# Patient Record
Sex: Female | Born: 1965 | Race: Black or African American | Hispanic: No | Marital: Single | Smoking: Former smoker
Health system: Southern US, Community
[De-identification: ages and names within clinical notes are randomized; demographics above are authoritative.]

## PROBLEM LIST (undated history)

## (undated) DIAGNOSIS — K219 Gastro-esophageal reflux disease without esophagitis: Secondary | ICD-10-CM

## (undated) DIAGNOSIS — C50919 Malignant neoplasm of unspecified site of unspecified female breast: Secondary | ICD-10-CM

## (undated) HISTORY — PX: ABDOMINAL HYSTERECTOMY: SHX81

## (undated) HISTORY — PX: ELBOW SURGERY: SHX618

---

## 2014-09-13 DIAGNOSIS — C50919 Malignant neoplasm of unspecified site of unspecified female breast: Secondary | ICD-10-CM

## 2014-09-13 HISTORY — DX: Malignant neoplasm of unspecified site of unspecified female breast: C50.919

## 2016-06-21 ENCOUNTER — Emergency Department (HOSPITAL_BASED_OUTPATIENT_CLINIC_OR_DEPARTMENT_OTHER)
Admission: EM | Admit: 2016-06-21 | Discharge: 2016-06-21 | Disposition: A | Discharge status: Home / Self Care | Payer: Commercial Managed Care - HMO | Attending: Emergency Medicine | Admitting: Emergency Medicine

## 2016-06-21 ENCOUNTER — Encounter (HOSPITAL_BASED_OUTPATIENT_CLINIC_OR_DEPARTMENT_OTHER): Payer: Self-pay | Admitting: *Deleted

## 2016-06-21 DIAGNOSIS — H1132 Conjunctival hemorrhage, left eye: Secondary | ICD-10-CM | POA: Diagnosis not present

## 2016-06-21 DIAGNOSIS — Z853 Personal history of malignant neoplasm of breast: Secondary | ICD-10-CM | POA: Diagnosis not present

## 2016-06-21 DIAGNOSIS — H579 Unspecified disorder of eye and adnexa: Secondary | ICD-10-CM | POA: Diagnosis present

## 2016-06-21 HISTORY — DX: Gastro-esophageal reflux disease without esophagitis: K21.9

## 2016-06-21 HISTORY — DX: Malignant neoplasm of unspecified site of unspecified female breast: C50.919

## 2016-06-21 NOTE — ED Provider Notes (Signed)
[facility] Provider Note   CSN: [id number] Arrival date & time: 06/21/16  1329     History   Chief Complaint Chief Complaint  Patient presents with  . Eye Problem    HPI Alyssa Benitez is a 50 y.o. female.  HPI Alyssa Benitez is a 51 y.o. female with PMH significant for acid reflux and breast cancer currently on chemo who presents with non-painful, constant, mild left eye redness she noticed this morning upon wakening.  No eye pain, visual disturbance, eye pain with movement, eye swelling, photophobia, injury/trauma, fever, chills.  No medications PTA.  She called her oncologist who instructed her to come to the ED for evaluation.  Past Medical History:  Diagnosis Date  . Acid reflux   . Breast cancer ([place])     There are no active problems to display for this patient.   History reviewed. No pertinent surgical history.  OB History    No data available       Home Medications    Prior to Admission medications   Not on File    Family History History reviewed. No pertinent family history.  Social History Social History  Substance Use Topics  . Smoking status: Never Smoker  . Smokeless tobacco: Never Used  . Alcohol use Not on file     Allergies   Morphine and related and Vicodin [hydrocodone-acetaminophen]   Review of Systems Review of Systems All other systems negative unless otherwise stated in HPI   Physical Exam Updated Vital Signs BP 112/83 (BP Location: Right Arm)   Pulse 97   Temp 98.3 F (36.8 C) (Oral)   Resp 18   SpO2 100%   Physical Exam  Constitutional: She is oriented to person, place, and time. She appears well-developed and well-nourished.  HENT:  Head: Normocephalic and atraumatic.  Right Ear: External ear normal.  Left Ear: External ear normal.  Eyes: EOM and lids are normal. Pupils are equal, round, and reactive to light. Lids are everted and swept, no foreign bodies found. Right eye exhibits no chemosis, no  discharge, no exudate and no hordeolum. No foreign body present in the right eye. Left eye exhibits no chemosis, no discharge, no exudate and no hordeolum. No foreign body present in the left eye. Right conjunctiva is not injected. Right conjunctiva has no hemorrhage. Left conjunctiva is not injected. Left conjunctiva has a hemorrhage. No scleral icterus.  Slit lamp exam:      The left eye shows no hyphema.  2 small conjunctival hemorrhages to medial aspect. No hyphema.     Visual Acuity  Right Eye Distance: 20/25 Left Eye Distance: 20/25 Bilateral Distance: 20/25    Neck: No tracheal deviation present.  Pulmonary/Chest: Effort normal. No respiratory distress.  Abdominal: She exhibits no distension.  Musculoskeletal: Normal range of motion.  Neurological: She is alert and oriented to person, place, and time.  Skin: Skin is warm and dry.  Psychiatric: She has a normal mood and affect. Her behavior is normal.     ED Treatments / Results  Labs (all labs ordered are listed, but only abnormal results are displayed) Labs Reviewed - No data to display  EKG  EKG Interpretation None       Radiology No results found.  Procedures Procedures (including critical care time)  Medications Ordered in ED Medications - No data to display   Initial Impression / Assessment and Plan / ED Course  I have reviewed the triage vital signs and the nursing notes.  Pertinent labs & imaging results that were available during my care of the patient were reviewed by me and considered in my medical decision making (see chart for details).  Clinical Course    Findings c/w subconjunctival hemorrhage.  No systemic symptoms.  No visual disturbances.  No hyphema.  Do not suspect corneal abrasion or ulcer.  Do not suspect uveitis or optic neuritis.  Do not suspect conjunctivitis.  No signs of orbital cellulitis.  Return precautions discussed.  Patient stable for discharge.    Final Clinical  Impressions(s) / ED Diagnoses   Final diagnoses:  Subconjunctival hemorrhage of left eye    New Prescriptions New Prescriptions   No medications on file     Gloriann Loan, PA-C 06/21/16 [place], PA-C 06/21/16 [place], MD 06/21/16 2128

## 2016-06-21 NOTE — ED Triage Notes (Signed)
Pt amb to room 6 with quick steady gait with this rn, pt is masked and neutropenic precautions are in place per pt request. Pt is breast cancer pt with chemo yesterday. Pt denies any fevers, states she developed left eye redness earlier today, her oncologist told her to come to ed for eval.

## 2016-12-13 ENCOUNTER — Inpatient Hospital Stay (HOSPITAL_COMMUNITY)
Admission: EM | Admit: 2016-12-13 | Discharge: 2016-12-15 | DRG: 101 | Disposition: A | Discharge status: Home / Self Care | Payer: Medicaid - Out of State | Attending: Internal Medicine | Admitting: Internal Medicine

## 2016-12-13 ENCOUNTER — Encounter (HOSPITAL_COMMUNITY): Payer: Self-pay | Admitting: Emergency Medicine

## 2016-12-13 ENCOUNTER — Emergency Department (HOSPITAL_COMMUNITY): Payer: Medicaid - Out of State

## 2016-12-13 DIAGNOSIS — Z886 Allergy status to analgesic agent status: Secondary | ICD-10-CM

## 2016-12-13 DIAGNOSIS — C7889 Secondary malignant neoplasm of other digestive organs: Secondary | ICD-10-CM | POA: Diagnosis present

## 2016-12-13 DIAGNOSIS — K219 Gastro-esophageal reflux disease without esophagitis: Secondary | ICD-10-CM | POA: Diagnosis present

## 2016-12-13 DIAGNOSIS — Z7952 Long term (current) use of systemic steroids: Secondary | ICD-10-CM

## 2016-12-13 DIAGNOSIS — T380X5A Adverse effect of glucocorticoids and synthetic analogues, initial encounter: Secondary | ICD-10-CM | POA: Diagnosis present

## 2016-12-13 DIAGNOSIS — Z885 Allergy status to narcotic agent status: Secondary | ICD-10-CM

## 2016-12-13 DIAGNOSIS — Z90710 Acquired absence of both cervix and uterus: Secondary | ICD-10-CM

## 2016-12-13 DIAGNOSIS — Z79899 Other long term (current) drug therapy: Secondary | ICD-10-CM

## 2016-12-13 DIAGNOSIS — C7800 Secondary malignant neoplasm of unspecified lung: Secondary | ICD-10-CM | POA: Diagnosis present

## 2016-12-13 DIAGNOSIS — C7931 Secondary malignant neoplasm of brain: Secondary | ICD-10-CM | POA: Diagnosis present

## 2016-12-13 DIAGNOSIS — F410 Panic disorder [episodic paroxysmal anxiety] without agoraphobia: Secondary | ICD-10-CM | POA: Diagnosis present

## 2016-12-13 DIAGNOSIS — C50919 Malignant neoplasm of unspecified site of unspecified female breast: Secondary | ICD-10-CM | POA: Diagnosis present

## 2016-12-13 DIAGNOSIS — R569 Unspecified convulsions: Secondary | ICD-10-CM

## 2016-12-13 DIAGNOSIS — G40909 Epilepsy, unspecified, not intractable, without status epilepticus: Principal | ICD-10-CM | POA: Diagnosis present

## 2016-12-13 DIAGNOSIS — R739 Hyperglycemia, unspecified: Secondary | ICD-10-CM | POA: Diagnosis present

## 2016-12-13 DIAGNOSIS — E876 Hypokalemia: Secondary | ICD-10-CM | POA: Diagnosis present

## 2016-12-13 DIAGNOSIS — Z87891 Personal history of nicotine dependence: Secondary | ICD-10-CM

## 2016-12-13 LAB — I-STAT VENOUS BLOOD GAS, ED
ACID-BASE EXCESS: 4 mmol/L — AB (ref 0.0–2.0)
BICARBONATE: 30.1 mmol/L — AB (ref 20.0–28.0)
O2 Saturation: 86 %
PO2 VEN: 54 mmHg — AB (ref 32.0–45.0)
TCO2: 32 mmol/L (ref 0–100)
pCO2, Ven: 52 mmHg (ref 44.0–60.0)
pH, Ven: 7.37 (ref 7.250–7.430)

## 2016-12-13 LAB — CBC WITH DIFFERENTIAL/PLATELET
BASOS ABS: 0 10*3/uL (ref 0.0–0.1)
Basophils Relative: 1 %
Eosinophils Absolute: 0 10*3/uL (ref 0.0–0.7)
Eosinophils Relative: 0 %
HEMATOCRIT: 29.3 % — AB (ref 36.0–46.0)
Hemoglobin: 9.4 g/dL — ABNORMAL LOW (ref 12.0–15.0)
LYMPHS PCT: 21 %
Lymphs Abs: 0.8 10*3/uL (ref 0.7–4.0)
MCH: 26.1 pg (ref 26.0–34.0)
MCHC: 32.1 g/dL (ref 30.0–36.0)
MCV: 81.4 fL (ref 78.0–100.0)
Monocytes Absolute: 0.4 10*3/uL (ref 0.1–1.0)
Monocytes Relative: 11 %
NEUTROS PCT: 68 %
Neutro Abs: 2.6 10*3/uL (ref 1.7–7.7)
PLATELETS: 110 10*3/uL — AB (ref 150–400)
RBC: 3.6 MIL/uL — AB (ref 3.87–5.11)
RDW: 16.7 % — ABNORMAL HIGH (ref 11.5–15.5)
WBC: 3.9 10*3/uL — AB (ref 4.0–10.5)

## 2016-12-13 LAB — BASIC METABOLIC PANEL
ANION GAP: 10 (ref 5–15)
BUN: 11 mg/dL (ref 6–20)
CO2: 26 mmol/L (ref 22–32)
Calcium: 8.4 mg/dL — ABNORMAL LOW (ref 8.9–10.3)
Chloride: 99 mmol/L — ABNORMAL LOW (ref 101–111)
Creatinine, Ser: 0.66 mg/dL (ref 0.44–1.00)
GFR calc Af Amer: >60 mL/min (ref >60)
GLUCOSE: 351 mg/dL — AB (ref 65–99)
POTASSIUM: 3 mmol/L — AB (ref 3.5–5.1)
Sodium: 135 mmol/L (ref 135–145)

## 2016-12-13 MED ORDER — SODIUM CHLORIDE 0.9 % IV BOLUS (SEPSIS)
1000.0000 mL | Freq: Once | INTRAVENOUS | Status: AC
  Administered 2016-12-13: 1000 mL via INTRAVENOUS

## 2016-12-13 NOTE — ED Provider Notes (Signed)
Patient reportedly had seizure immediately prior to coming here witnessed by her daughter. Her daughter states that immediately prior to calling 911 the patient was acting appropriately then began to foam at the mouth and reportedly bit her tongue. As best daughter knows she has been compliant with medications. Decadron dose has recently been cut back she received Versed 10 mg IV by EMS. On exam patient opens eyes to noxious stimulus. Has nonpurposeful movement to noxious stimulus. Gag reflex is intact   Orlie Dakin, MD 12/13/16 2356

## 2016-12-13 NOTE — H&P (Signed)
History and Physical    Alyssa Benitez ONG:[id number] DOB: Jul 18, 1966 DOA: 12/13/2016  PCP: Lynnette Caffey, MD - [place], [place] Consultants:  [facility] Patient coming from: Lives in [place], [place] - came to visit daughter in [place] on Monday; [place]: daughter, [phone]  Chief Complaint: seizure  HPI: Alyssa Benitez is a 51 y.o. 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  Her daughter was preparing to take her out for a drive.  The patient was sitting in a chair, didn't look like she was there.  She wouldn't look at her daughter, was not focusing.  Her balance was off and her hands were trying to get something right in front of her and she couldn't see it.  The daughter left the room and went to get her phone to text her aunt and suddenly the patient started seizing.  She called 911 and was on the phone with them with the paramedics arrived 6+ minutes later and she was still seizing.  Seized nonstop for prolonged period, maybe 20+ minutes; she stopped seizing shortly before arriving in the ER.   Stage 4 breast cancer, in treatment, last treatment was 4/25.  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  At last visit, the doctor decreased the dexamethasone from 4 mg BID to 2 mg BID.  The daughter's impression is that the swelling was improving and so that is why they decreased the steroids.  Last August was her last seizure.  She had just had her steroid dose decreased at that time too.   ED Course:  EMS gave Versed 5 mg x 2, estimated time of seizure 30 minutes.  Patient was later found by nursing staff walking around her room, confused.  She remains confused, hypersomnolent, and perseverates answers to questions.  Review of Systems: Unable to perform  Ambulatory Status:  Ambulates with a cane  Past Medical  History:  Diagnosis Date  . Acid reflux   . Breast cancer ([place]) 09/2014   stage 4    Past Surgical History:  Procedure Laterality Date  . ABDOMINAL HYSTERECTOMY    . ELBOW SURGERY      Social History   Social History  . Marital status: Single    Spouse name: N/A  . Number of children: N/A  . Years of education: N/A   Occupational History  . disabled    Social History Main Topics  . Smoking status: Former Smoker    Packs/day: 0.50    Years: 10.00  . Smokeless tobacco: Never Used  . Alcohol use No  . Drug use: No  . Sexual activity: Not on file   Other Topics Concern  . Not on file   Social History Narrative  . No narrative on file    Allergies  Allergen Reactions  . Morphine And Related Other (See Comments)    Patient preference    Family History  Problem Relation Age of Onset  . Breast cancer Maternal Grandmother   . Breast cancer Cousin     Prior to Admission medications   Medication Sig Start Date End Date Taking? Authorizing Provider  ado-trastuzumab emtansine 3.6 mg/kg in sodium chloride 0.9 % 250 mL Inject 3.6 mg/kg into the vein every 21 ( twenty-one) days.   Yes Historical Provider, MD  dexamethasone (DECADRON) 4 MG tablet Take 2 mg by mouth 2 (two) times daily.  Yes Historical Provider, MD  dronabinol (MARINOL) 2.5 MG capsule Take 2.5 mg by mouth 2 (two) times daily before lunch and supper.   Yes Historical Provider, MD  fentaNYL (DURAGESIC - DOSED MCG/HR) 50 MCG/HR Place 25 mcg onto the skin every 3 (three) days.   Yes Historical Provider, MD  levETIRAcetam (KEPPRA) 500 MG tablet Take 500 mg by mouth 2 (two) times daily.   Yes Historical Provider, MD  ondansetron (ZOFRAN) 4 MG tablet Take 4 mg by mouth every 8 (eight) hours as needed for nausea or vomiting.   Yes Historical Provider, MD  potassium chloride SA (K-DUR,KLOR-CON) 20 MEQ tablet Take 20 mEq by mouth every evening.   Yes Historical Provider, MD  traMADol (ULTRAM) 50 MG tablet Take by  mouth every 12 (twelve) hours as needed.   Yes Historical Provider, MD    Physical Exam: Vitals:   12/13/16 2000 12/13/16 2015 12/13/16 2253 12/14/16 0032  BP: (!) 131/98 137/87  109/78  Pulse: (!) 105   83  Resp: (!) 21   17  Temp:   98.3 F (36.8 C) 99 F (37.2 C)  TempSrc:    Oral  SpO2: 100%   100%     General: Somnolent, confused Eyes:  PERRL, EOMI, normal lids, iris ENT:  grossly normal hearing, lips & tongue, mmm Neck:  no LAD, masses or thyromegaly Cardiovascular:  RRR, no m/r/g. No LE edema.  Respiratory:  CTA bilaterally, no w/r/r. Normal respiratory effort. Abdomen:  soft, nd, NABS, marked TTP in LLQ/periumbilical region Skin:  no rash or induration seen on limited exam Musculoskeletal:  grossly normal tone BUE/BLE, good ROM, no bony abnormality Psychiatric:  grossly normal mood and affect, speech fluent and appropriate, AOx2 - knows her first name and that she is in the hospital but not oriented to time at all and does not recognize her daughter (says her name is "thirteen") Neurologic:  CN 2-12 grossly intact, moves all extremities in coordinated fashion, sensation intact  Labs on Admission: I have personally reviewed following labs and imaging studies  CBC:  Recent Labs Lab 12/13/16 1945  WBC 3.9*  NEUTROABS 2.6  HGB 9.4*  HCT 29.3*  MCV 81.4  PLT 194*   Basic Metabolic Panel:  Recent Labs Lab 12/13/16 1945  NA 135  K 3.0*  CL 99*  CO2 26  GLUCOSE 351*  BUN 11  CREATININE 0.66  CALCIUM 8.4*   GFR: CrCl cannot be calculated (Unknown ideal weight.). Liver Function Tests: No results for input(s): AST, ALT, ALKPHOS, BILITOT, PROT, ALBUMIN in the last 168 hours. No results for input(s): LIPASE, AMYLASE in the last 168 hours. No results for input(s): AMMONIA in the last 168 hours. Coagulation Profile: No results for input(s): INR, PROTIME in the last 168 hours. Cardiac Enzymes: No results for input(s): CKTOTAL, CKMB, CKMBINDEX, TROPONINI in  the last 168 hours. BNP (last 3 results) No results for input(s): PROBNP in the last 8760 hours. HbA1C: No results for input(s): HGBA1C in the last 72 hours. CBG: No results for input(s): GLUCAP in the last 168 hours. Lipid Profile: No results for input(s): CHOL, HDL, LDLCALC, TRIG, CHOLHDL, LDLDIRECT in the last 72 hours. Thyroid Function Tests: No results for input(s): TSH, T4TOTAL, FREET4, T3FREE, THYROIDAB in the last 72 hours. Anemia Panel: No results for input(s): VITAMINB12, FOLATE, FERRITIN, TIBC, IRON, RETICCTPCT in the last 72 hours. Urine analysis: No results found for: COLORURINE, APPEARANCEUR, LABSPEC, PHURINE, GLUCOSEU, HGBUR, BILIRUBINUR, KETONESUR, PROTEINUR, UROBILINOGEN, NITRITE, LEUKOCYTESUR  Creatinine Clearance:  CrCl cannot be calculated (Unknown ideal weight.).  Sepsis Labs: @LABRCNTIP (procalcitonin:4,lacticidven:4) )No results found for this or any previous visit (from the past 240 hour(s)).   Radiological Exams on Admission: Ct Head Wo Contrast  Result Date: 12/13/2016 CLINICAL DATA:  Seizures.  Breast cancer with brain metastasis. EXAM: CT HEAD WITHOUT CONTRAST TECHNIQUE: Contiguous axial images were obtained from the base of the skull through the vertex without intravenous contrast. COMPARISON:  None. FINDINGS: Brain: Diffusely enlarged ventricles and subarachnoid spaces. Patchy white matter low density in both cerebral hemispheres. Right temporal lobe calcification. No intracranial hemorrhage, mass lesion or CT evidence of acute infarction. Vascular: No hyperdense vessel or unexpected calcification. Skull: Normal. Negative for fracture or focal lesion. Sinuses/Orbits: Small amount of mucosal thickening in both maxillary sinuses. Opacified right posterior ethmoid sinus air cell. Mild left ethmoid sinus mucosal thickening. Unremarkable orbits. Other: None. IMPRESSION: 1. No acute abnormality. 2. Mild diffuse cerebral and cerebellar atrophy. 3. Extensive patchy white  matter low density in both cerebral hemispheres, most likely representing postradiation changes. 4. Right temporal lobe calcification, compatible with treated metastatic disease. Electronically Signed   By: Claudie Revering M.D.   On: 12/13/2016 20:34    EKG: Independently reviewed.  NSR with rate 94; no evidence of acute ischemia  Assessment/Plan Principal Problem:   Seizure (HCC) Active Problems:   Metastatic breast cancer (HCC)   Hyperglycemia   Hypokalemia   Seizure -Patient brought in by EMS with prolonged seizure, lasting approximately 30 minutes -Known brain mets, h/o seizure activity -Last seizure activity and current both appear to have occurred when dexamethasone was decreased from 4 mg PO BID to 2 mg PO BID -Will resume 4 mg BID but for now will give IV since patient remains post-ictal -After such a prolonged seizure, it is not unexpected that she remains post-ictal for a more prolonged period of time.  However, further progression of brain mets (despite no obvious findings on CT) and anoxic brain injury are also possibilities. -If her mental status does not clear by tomorrow, would recommend neuro consultation. 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) -She also has reported h/o periumbilical TTP -She definitely had periumblical TTP/LLQ pain despite post-ictal state; if still present tomorrow when she is no longer post-ictal, she may need  imaging -Hgb 9.4; 10.8 in 9/17 -Platelets 110; 150 in 9/17 -Overall prognosis for her cancer is quite poor -Code status discussed with daughter and encouraged her to discuss with patient and family when able  Hypokalemia -K+ 3.0 -Will replete and recheck in AM  Hypergylcemia -Glucose 351 -May be stress response and/or due to steroids -Will follow with fasting AM labs -It is unlikely that he will need acute or chronic treatment for this issue     DVT prophylaxis: Lovenox Code Status: Full - confirmed with patient Family Communication: Daughter present throughout evaluation Disposition Plan:  Home once clinically improved Consults called: None  Admission status: It is my clinical opinion that referral for OBSERVATION is reasonable and necessary in this patient  based on the above information provided. The aforementioned taken together are felt to place the patient at high risk for further clinical deterioration. However it is anticipated that the patient may be medically stable for discharge from the hospital within 24 to 48 hours.    Karmen Bongo MD [facility]  If 7PM-7AM, please contact night-coverage [email or web address] Password [email or web address]  12/14/2016, 12:58 AM

## 2016-12-13 NOTE — ED Notes (Signed)
Report attempted 

## 2016-12-13 NOTE — ED Notes (Signed)
Pt found walking around room without clothes, had removed IV. Pt placed in gown and put her own pants back on. Pt confused but following commands

## 2016-12-13 NOTE — ED Triage Notes (Signed)
Pt here from home with c/o seizures , pt has CA with mets to brain , last seizure 1 year ago , pt seized for approx 30 mins , pt received 5 of versed IV 5 of versed IM for a total 10 mg

## 2016-12-13 NOTE — ED Provider Notes (Signed)
[facility] DEPT Provider Note   CSN: [id number] Arrival date & time: 12/13/16  1856     History   Chief Complaint Chief Complaint  Patient presents with  . Seizures    HPI Alyssa Benitez is a 51 y.o. female.  The history is provided by the EMS personnel.  Seizures   This is a recurrent problem. The current episode started less than 1 hour ago. The problem has been resolved. There was 1 seizure. Duration: EMS estimates 76mins. Characteristics include rhythmic jerking. The episode was witnessed. The seizures did not continue in the ED. The seizure(s) had no focality. known brain mets Meds prior to arrival: 10mg  Versed.    Past Medical History:  Diagnosis Date  . Acid reflux   . Breast cancer ([place])     There are no active problems to display for this patient.   History reviewed. No pertinent surgical history.  OB History    No data available       Home Medications    Prior to Admission medications   Not on File    Family History No family history on file.  Social History Social History  Substance Use Topics  . Smoking status: Never Smoker  . Smokeless tobacco: Never Used  . Alcohol use Not on file     Allergies   Morphine and related and Vicodin [hydrocodone-acetaminophen]   Review of Systems Review of Systems  Unable to perform ROS: Mental status change (post-ictal and sedated)  Neurological: Positive for seizures.   Physical Exam Updated Vital Signs BP 108/88   Pulse 92   Temp 98.1 F (36.7 C) (Tympanic)   Resp 18   SpO2 100%   Physical Exam  Constitutional: She appears well-developed and well-nourished. No distress.  HENT:  Head: Normocephalic.  NPA in place  Eyes: Conjunctivae are normal.  2mm pupils b/l  Neck: Neck supple.  Cardiovascular: Normal rate and regular rhythm.   No murmur heard. Scar on left chest possibly from prior lumpectomy  Pulmonary/Chest: Effort normal and breath sounds normal. No respiratory distress.    Abdominal: Soft. She exhibits no distension.  Musculoskeletal: She exhibits no edema or deformity.  Neurological:  Sedated, moved all 4ext spontnaeously  Skin: Skin is warm and dry.  Psychiatric:  Unable to assess  Nursing note and vitals reviewed.    ED Treatments / Results  Labs (all labs ordered are listed, but only abnormal results are displayed) Labs Reviewed - No data to display  EKG  EKG Interpretation None       Radiology No results found.  Procedures Procedures (including critical care time)  Medications Ordered in ED Medications - No data to display   Initial Impression / Assessment and Plan / ED Course  I have reviewed the triage vital signs and the nursing notes.  Pertinent labs & imaging results that were available during my care of the patient were reviewed by me and considered in my medical decision making (see chart for details).    Pt with h/o breast CA & known brain mets presents with a seizure. All history provided by EMS. When they arrived the Pt was having tonic activity w/clenched jaw; they gave 5mg  Versed & inserted an NPA prior to transporting the Pt. Family says the Pt was sitting in a chair talking with her daughter when she seized; she did not fall or injure herself. Tonic activity continued during transport & EMS gave her another 5mg  Versed which finally broke the seizure. Medics estimate she  was actively seizing for 19mins in total. Family reported last seizure was in August '17.  VS & exam as above. EKG: NSR @ 94bpm w/o signs of ischemia. Labs & CT head ordered.  Daughter arrived and said the Pt was in her normal state of health this morning. Last week she had her dexamethasone dose decreased, but has otherwise been on a stable medication regimen; last chemo treatment was on 4/25.  Labs remarkable for K 3.0, Hgb 9.4; no priors for comparison. CT w/o acute abnormalities.  Pt was found by nursing staff walking around her room confused but  able to follow commands. After being asked to get back in her bed, she went back to sleep.  On re-assessment, Pt alert, but still confused and perseverating when asked simple questions. Daughter lives in a 3rd floor apartment & doesn't feel safe taking her home tonight.  Will admit the Pt to the Hospitalist's service for overnight observation.  Final Clinical Impressions(s) / ED Diagnoses   Final diagnoses:  Seizure [facility])    New Prescriptions New Prescriptions   No medications on file     Jenny Reichmann, MD 12/14/16 0020    Orlie Dakin, MD 12/14/16 0223

## 2016-12-14 ENCOUNTER — Inpatient Hospital Stay (HOSPITAL_COMMUNITY): Payer: Medicaid - Out of State

## 2016-12-14 DIAGNOSIS — C7931 Secondary malignant neoplasm of brain: Secondary | ICD-10-CM

## 2016-12-14 DIAGNOSIS — C50919 Malignant neoplasm of unspecified site of unspecified female breast: Secondary | ICD-10-CM | POA: Diagnosis present

## 2016-12-14 DIAGNOSIS — C7889 Secondary malignant neoplasm of other digestive organs: Secondary | ICD-10-CM | POA: Diagnosis present

## 2016-12-14 DIAGNOSIS — G40909 Epilepsy, unspecified, not intractable, without status epilepticus: Secondary | ICD-10-CM | POA: Diagnosis not present

## 2016-12-14 DIAGNOSIS — F410 Panic disorder [episodic paroxysmal anxiety] without agoraphobia: Secondary | ICD-10-CM | POA: Diagnosis present

## 2016-12-14 DIAGNOSIS — G9349 Other encephalopathy: Secondary | ICD-10-CM | POA: Diagnosis not present

## 2016-12-14 DIAGNOSIS — R569 Unspecified convulsions: Secondary | ICD-10-CM | POA: Diagnosis not present

## 2016-12-14 DIAGNOSIS — Z87891 Personal history of nicotine dependence: Secondary | ICD-10-CM | POA: Diagnosis not present

## 2016-12-14 DIAGNOSIS — K219 Gastro-esophageal reflux disease without esophagitis: Secondary | ICD-10-CM | POA: Diagnosis present

## 2016-12-14 DIAGNOSIS — G9340 Encephalopathy, unspecified: Secondary | ICD-10-CM

## 2016-12-14 DIAGNOSIS — Z79899 Other long term (current) drug therapy: Secondary | ICD-10-CM | POA: Diagnosis not present

## 2016-12-14 DIAGNOSIS — T380X5A Adverse effect of glucocorticoids and synthetic analogues, initial encounter: Secondary | ICD-10-CM | POA: Diagnosis present

## 2016-12-14 DIAGNOSIS — Z90710 Acquired absence of both cervix and uterus: Secondary | ICD-10-CM | POA: Diagnosis not present

## 2016-12-14 DIAGNOSIS — R739 Hyperglycemia, unspecified: Secondary | ICD-10-CM | POA: Diagnosis present

## 2016-12-14 DIAGNOSIS — Z885 Allergy status to narcotic agent status: Secondary | ICD-10-CM | POA: Diagnosis not present

## 2016-12-14 DIAGNOSIS — C7800 Secondary malignant neoplasm of unspecified lung: Secondary | ICD-10-CM | POA: Diagnosis present

## 2016-12-14 DIAGNOSIS — Z886 Allergy status to analgesic agent status: Secondary | ICD-10-CM | POA: Diagnosis not present

## 2016-12-14 DIAGNOSIS — E876 Hypokalemia: Secondary | ICD-10-CM | POA: Diagnosis present

## 2016-12-14 DIAGNOSIS — Z7952 Long term (current) use of systemic steroids: Secondary | ICD-10-CM | POA: Diagnosis not present

## 2016-12-14 LAB — COMPREHENSIVE METABOLIC PANEL
ALT: 169 U/L — ABNORMAL HIGH (ref 14–54)
AST: 104 U/L — AB (ref 15–41)
Albumin: 3.1 g/dL — ABNORMAL LOW (ref 3.5–5.0)
Alkaline Phosphatase: 293 U/L — ABNORMAL HIGH (ref 38–126)
Anion gap: 13 (ref 5–15)
BUN: 5 mg/dL — AB (ref 6–20)
CHLORIDE: 104 mmol/L (ref 101–111)
CO2: 22 mmol/L (ref 22–32)
Calcium: 8.8 mg/dL — ABNORMAL LOW (ref 8.9–10.3)
Creatinine, Ser: 0.44 mg/dL (ref 0.44–1.00)
GFR calc Af Amer: >60 mL/min (ref >60)
Glucose, Bld: 193 mg/dL — ABNORMAL HIGH (ref 65–99)
POTASSIUM: 3.5 mmol/L (ref 3.5–5.1)
SODIUM: 139 mmol/L (ref 135–145)
Total Bilirubin: 1 mg/dL (ref 0.3–1.2)
Total Protein: 5.8 g/dL — ABNORMAL LOW (ref 6.5–8.1)

## 2016-12-14 LAB — GLUCOSE, CAPILLARY: GLUCOSE-CAPILLARY: 332 mg/dL — AB (ref 65–99)

## 2016-12-14 MED ORDER — FENTANYL 25 MCG/HR TD PT72
25.0000 ug | MEDICATED_PATCH | TRANSDERMAL | Status: DC
  Administered 2016-12-14: 25 ug via TRANSDERMAL
  Filled 2016-12-14: qty 1

## 2016-12-14 MED ORDER — SODIUM CHLORIDE 0.9 % IV SOLN
500.0000 mg | Freq: Two times a day (BID) | INTRAVENOUS | Status: DC
  Administered 2016-12-14 (×2): 500 mg via INTRAVENOUS
  Filled 2016-12-14 (×2): qty 5

## 2016-12-14 MED ORDER — SODIUM CHLORIDE 0.9 % IV SOLN
75.0000 mL/h | INTRAVENOUS | Status: DC
  Administered 2016-12-14 – 2016-12-15 (×3): 75 mL/h via INTRAVENOUS

## 2016-12-14 MED ORDER — ONDANSETRON HCL 4 MG PO TABS: 4.0000 mg | ORAL_TABLET | Freq: Four times a day (QID) | ORAL | Status: DC | PRN

## 2016-12-14 MED ORDER — ONDANSETRON HCL 4 MG/2ML IJ SOLN: 4.0000 mg | Freq: Four times a day (QID) | INTRAMUSCULAR | Status: DC | PRN

## 2016-12-14 MED ORDER — BOOST PLUS PO LIQD
237.0000 mL | Freq: Three times a day (TID) | ORAL | Status: DC
  Administered 2016-12-14 – 2016-12-15 (×5): 237 mL via ORAL
  Filled 2016-12-14 (×9): qty 237

## 2016-12-14 MED ORDER — ENOXAPARIN SODIUM 40 MG/0.4ML ~~LOC~~ SOLN
40.0000 mg | SUBCUTANEOUS | Status: DC
  Administered 2016-12-14 – 2016-12-15 (×2): 40 mg via SUBCUTANEOUS
  Filled 2016-12-14 (×2): qty 0.4

## 2016-12-14 MED ORDER — ACETAMINOPHEN 325 MG PO TABS: 650.0000 mg | ORAL_TABLET | ORAL | Status: DC | PRN

## 2016-12-14 MED ORDER — POTASSIUM CHLORIDE 10 MEQ/100ML IV SOLN
10.0000 meq | INTRAVENOUS | Status: AC
  Administered 2016-12-14 (×6): 10 meq via INTRAVENOUS
  Filled 2016-12-14 (×6): qty 100

## 2016-12-14 MED ORDER — ACETAMINOPHEN 650 MG RE SUPP: 650.0000 mg | RECTAL | Status: DC | PRN

## 2016-12-14 MED ORDER — SODIUM CHLORIDE 0.9 % IV SOLN
1000.0000 mg | Freq: Two times a day (BID) | INTRAVENOUS | Status: DC
  Administered 2016-12-14: 1000 mg via INTRAVENOUS
  Filled 2016-12-14 (×2): qty 10

## 2016-12-14 MED ORDER — DEXAMETHASONE SODIUM PHOSPHATE 10 MG/ML IJ SOLN
4.0000 mg | Freq: Two times a day (BID) | INTRAMUSCULAR | Status: DC
  Administered 2016-12-14 (×3): 4 mg via INTRAVENOUS
  Filled 2016-12-14 (×3): qty 1

## 2016-12-14 NOTE — Procedures (Signed)
Electroencephalogram (EEG) Report  Date of study: 12/14/16  Requesting clinician: Melba Coon M.D.  Reason for study: Evaluate for seizure  Brief clinical history: This is a 51 year old woman with history of metastatic breast cancer to brain and seizures. She now presents after a prolonged seizure lasting up to 30 minutes. EEG is performed for further evaluation.  Medications:  Current Facility-Administered Medications:  .  0.9 %  sodium chloride infusion, 75 mL/hr, Intravenous, Continuous, Karmen Bongo, MD, Last Rate: 75 mL/hr at 12/14/16 1343, 75 mL/hr at 12/14/16 1343 .  acetaminophen (TYLENOL) tablet 650 mg, 650 mg, Oral, Q4H PRN **OR** acetaminophen (TYLENOL) suppository 650 mg, 650 mg, Rectal, Q4H PRN, Karmen Bongo, MD .  dexamethasone (DECADRON) injection 4 mg, 4 mg, Intravenous, Q12H, Karmen Bongo, MD, 4 mg at 12/14/16 1032 .  enoxaparin (LOVENOX) injection 40 mg, 40 mg, Subcutaneous, Q24H, Karmen Bongo, MD, 40 mg at 12/14/16 1031 .  fentaNYL (DURAGESIC - dosed mcg/hr) patch 25 mcg, 25 mcg, Transdermal, Q72H, Karmen Bongo, MD, 25 mcg at 12/14/16 1032 .  lactose free nutrition (BOOST PLUS) liquid 237 mL, 237 mL, Oral, TID BM, Domenic Polite, MD, 237 mL at 12/14/16 1332 .  levETIRAcetam (KEPPRA) 1,000 mg in sodium chloride 0.9 % 100 mL IVPB, 1,000 mg, Intravenous, Q12H, Marliss Coots, PA-C .  ondansetron [facility]) tablet 4 mg, 4 mg, Oral, Q6H PRN **OR** ondansetron (ZOFRAN) injection 4 mg, 4 mg, Intravenous, Q6H PRN, Karmen Bongo, MD  Description: This is a routine EEG performed using standard international 10-20 electrode placement. A total of 18 channels are recorded, including one for the EKG. The patient is awake and drowsy during this recording.   Activating Maneuvers: None  Findings:  The EKG channel demonstrates a regular rhythm with a rate of 70 beats per minute. Occasional premature ventricular contractions are noted.  The background consists of well-formed  alpha activity. The best dominant posterior rhythm is 9-11 Hz. This is symmetric and reacts as expected with eye opening.   There is some intermixed theta>delta slowing involving the posterior left temporal lobe and the left parietal lobe. No epileptiform discharges are present. No seizures are recorded.   Drowsiness is recorded and is normal in appearance.   Impression:  This is an abnormal EEG due to intermixed focal slowing in the left posterior temporal and left parietal lobes. No epileptiform abnormalities.  Clinical correlation: This focal slowing on the left indicates regional cerebral dysfunction in that area. This corresponds to known area of abnormality on CT scan.   Melba Coon, MD [facility]

## 2016-12-14 NOTE — Progress Notes (Signed)
Erap, Vendela arrived to the unit via bed from the emergency department.  Patient is alert and oriented to self only.  Daughter is at bedside and assisted with completing most of health history.  Vital signs stable. No complaints of pain.  Peripheral IV intact to the left wrist.  Skin assessment completed.  Skin tear to the right forearm.  Area was cleansed with foam applied.  Educated the patient and family on how to reach the staff on the unit.  Side rails padded, oxygen set up and suction set up at bedside.  Explained to the patient and family the importance of calling for assistance before getting up.  Informed the daughter at bedside that the camera is activated in the room and she stated "great". Lowered the bed, activated the bed alarm and placed the call light within reach.  Will continue to monitor the patient.

## 2016-12-14 NOTE — Progress Notes (Signed)
Bedside EEG completed, results pending. 

## 2016-12-14 NOTE — Progress Notes (Signed)
Pt blood glucose was 332 at 1850. MD. Broadus John notified.

## 2016-12-14 NOTE — Consult Note (Signed)
NEURO HOSPITALIST CONSULT NOTE   Requestig physician: Dr. Broadus John   Reason for Consult: Seizure   History obtained from: chart  HPI:                                                                                                                                          Alyssa Benitez is an 51 y.o. 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  Her daughter was preparing to take her out for a drive.  The patient was sitting in a chair, didn't look like she was there.  She wouldn't look at her daughter, was not focusing.  Her balance was off and her hands were trying to get something right in front of her and she couldn't see it.  The daughter left the room and went to get her phone to text her aunt and suddenly the patient started seizing.  She called 911 and was on the phone with them with the paramedics arrived 6+ minutes later and she was still seizing.  Seized nonstop for prolonged period, maybe 20+ minutes; she stopped seizing shortly before arriving in the ER. EMS gave  Versed 5 mg x 2, estimated time of seizure 30 minutes.  Patient was later found by nursing staff walking around her room, confused.  She remains confused, hypersomnolent, and perseverates answers to questions.   At His in time patient is alert and oriented. No further seizures. Talking to both patient and daughter she has been stressed and overdoing it along with not sleeping well throughout the night. This may be the etiology of her breakthrough seizure.  Past Medical History:  Diagnosis Date  . Acid reflux   . Breast cancer ([place]) 09/2014   stage 4    Past Surgical History:  Procedure Laterality Date  . ABDOMINAL HYSTERECTOMY    . ELBOW SURGERY      Family History  Problem Relation Age of Onset   . Breast cancer Maternal Grandmother   . Breast cancer Cousin     Social History:  reports that she has quit smoking. She has a 5.00 pack-year smoking history. She has never used smokeless tobacco. She reports that she does not drink alcohol or use drugs.  Allergies  Allergen Reactions  . Morphine And Related Other (See Comments)    Patient preference    MEDICATIONS:  Scheduled: . dexamethasone  4 mg Intravenous Q12H  . enoxaparin (LOVENOX) injection  40 mg Subcutaneous Q24H  . fentaNYL  25 mcg Transdermal Q72H  . lactose free nutrition  237 mL Oral TID BM     ROS:                                                                                                                                       History obtained from the patient  General ROS: negative for - chills, fatigue, fever, night sweats, weight gain or weight loss Psychological ROS: negative for - behavioral disorder, hallucinations, memory difficulties, mood swings or suicidal ideation Ophthalmic ROS: negative for - blurry vision, double vision, eye pain or loss of vision ENT ROS: negative for - epistaxis, nasal discharge, oral lesions, sore throat, tinnitus or vertigo Allergy and Immunology ROS: negative for - hives or itchy/watery eyes Hematological and Lymphatic ROS: negative for - bleeding problems, bruising or swollen lymph nodes Endocrine ROS: negative for - galactorrhea, hair pattern changes, polydipsia/polyuria or temperature intolerance Respiratory ROS: negative for - cough, hemoptysis, shortness of breath or wheezing Cardiovascular ROS: negative for - chest pain, dyspnea on exertion, edema or irregular heartbeat Gastrointestinal ROS: negative for - abdominal pain, diarrhea, hematemesis, nausea/vomiting or stool incontinence Genito-Urinary ROS: negative for - dysuria, hematuria, incontinence or  urinary frequency/urgency Musculoskeletal ROS: negative for - joint swelling or muscular weakness Neurological ROS: as noted in HPI Dermatological ROS: negative for rash and skin lesion changes   Blood pressure 112/77, pulse 84, temperature 98.4 F (36.9 C), temperature source Oral, resp. rate 18, height 5\' 1"  (1.549 m), SpO2 100 %.   Neurologic Examination:                                                                                                      HEENT-  Normocephalic, no lesions, without obvious abnormality.  Normal external eye and conjunctiva.  Normal TM's bilaterally.  Normal auditory canals and external ears. Normal external nose, mucus membranes and septum.  Normal pharynx. Cardiovascular- S1, S2 normal, pulses palpable throughout   Lungs- chest clear, no wheezing, rales, normal symmetric air entry Abdomen- normal findings: bowel sounds normal Extremities- no edema Lymph-no adenopathy palpable Musculoskeletal-no joint tenderness, deformity or swelling Skin-warm and dry, no hyperpigmentation, vitiligo, or suspicious lesions  Neurological Examination Mental Status: Alert, oriented, thought content appropriate.  Speech fluent without evidence of aphasia.  Able to follow 3 step commands without difficulty. Cranial Nerves: II:  Visual fields  grossly normal, pupils equal, round, reactive to light and accommodation III,IV, VI: ptosis not present, extra-ocular motions intact bilaterally V,VII: smile symmetric, facial light touch sensation normal bilaterally VIII: hearing normal bilaterally IX,X: uvula rises symmetrically XI: bilateral shoulder shrug XII: midline tongue extension Motor: 4/5 throughout Sensory: Pinprick and light touch intact throughout, bilaterally Deep Tendon Reflexes: 2+ and symmetric throughout UE  No KJ or AJ Plantars: Right: downgoing   Left: downgoing Cerebellar: normal finger-to-nose, Gait: not tested      Lab Results: Basic Metabolic  Panel:  Recent Labs Lab 12/13/16 1945  NA 135  K 3.0*  CL 99*  CO2 26  GLUCOSE 351*  BUN 11  CREATININE 0.66  CALCIUM 8.4*    Liver Function Tests: No results for input(s): AST, ALT, ALKPHOS, BILITOT, PROT, ALBUMIN in the last 168 hours. No results for input(s): LIPASE, AMYLASE in the last 168 hours. No results for input(s): AMMONIA in the last 168 hours.  CBC:  Recent Labs Lab 12/13/16 1945  WBC 3.9*  NEUTROABS 2.6  HGB 9.4*  HCT 29.3*  MCV 81.4  PLT 110*    Cardiac Enzymes: No results for input(s): CKTOTAL, CKMB, CKMBINDEX, TROPONINI in the last 168 hours.  Lipid Panel: No results for input(s): CHOL, TRIG, HDL, CHOLHDL, VLDL, LDLCALC in the last 168 hours.  CBG: No results for input(s): GLUCAP in the last 168 hours.  Microbiology: No results found for this or any previous visit.  Coagulation Studies: No results for input(s): LABPROT, INR in the last 72 hours.  Imaging: Ct Head Wo Contrast  Result Date: 12/13/2016 CLINICAL DATA:  Seizures.  Breast cancer with brain metastasis. EXAM: CT HEAD WITHOUT CONTRAST TECHNIQUE: Contiguous axial images were obtained from the base of the skull through the vertex without intravenous contrast. COMPARISON:  None. FINDINGS: Brain: Diffusely enlarged ventricles and subarachnoid spaces. Patchy white matter low density in both cerebral hemispheres. Right temporal lobe calcification. No intracranial hemorrhage, mass lesion or CT evidence of acute infarction. Vascular: No hyperdense vessel or unexpected calcification. Skull: Normal. Negative for fracture or focal lesion. Sinuses/Orbits: Small amount of mucosal thickening in both maxillary sinuses. Opacified right posterior ethmoid sinus air cell. Mild left ethmoid sinus mucosal thickening. Unremarkable orbits. Other: None. IMPRESSION: 1. No acute abnormality. 2. Mild diffuse cerebral and cerebellar atrophy. 3. Extensive patchy white matter low density in both cerebral hemispheres, most  likely representing postradiation changes. 4. Right temporal lobe calcification, compatible with treated metastatic disease. Electronically Signed   By: Claudie Revering M.D.   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 Patient was on 500 mg Keppra twice a day and has been doing well for quite a while however lately per daughter she has not been sleeping well in addition she has been "pushing it"--doing more around the house and she should it not resting. Likely the etiology of her breakthrough seizure. At this time I'll increase her Keppra 1000 mg twice a day. She does have a neurologist she sees at home. We'll keep her on the dose of 1000 mg Keppra twice a day at discharge and she can follow-up with her primary neurologist in 1-2 weeks.  Per [facility] statutes, patients with seizures are not allowed to drive until  they have been seizure-free for six months. Use caution when using heavy equipment  or power tools. Avoid working on ladders or at heights. Take showers instead of baths. Ensure the water temperature is not too high on the home water heater. Do not go swimming alone. When caring for infants or small children, sit down when holding, feeding, or changing them to minimize risk of injury to the child in the event you have a seizure.

## 2016-12-14 NOTE — Evaluation (Signed)
Physical Therapy Evaluation Patient Details Name: Alyssa Benitez MRN: [id number] DOB: 28-Oct-1965 Today's Date: 12/14/2016   History of Present Illness  Pt is a 51 y/o female admitted secondary to seizures. PMH includes breast cancer with metastasis to brain and seizures.   Clinical Impression  Pt admitted for problem above with deficits below. PTA, pt was ambulating with cane, however, was unsteady. Per daughter, pt had assist 24/7 for mobility and ADL tasks. Upon evaluation, pt with unsteady gait and BLE weakness. Pt requiring min guard to min assist for mobility tasks. Educated pt's daughter about using RW at home and educated about home safety. Recommending RW and 3in1 to increase safety at home. Pt's daughter reports they have set up [facility] services at home and HHPT has been following pt. D/c recommendations below. Will continue to follow to maximize functional mobility independence.     Follow Up Recommendations No PT follow up;Supervision/Assistance - 24 hour    Equipment Recommendations  Rolling walker with 5" wheels;3in1 (PT)    Recommendations for Other Services       Precautions / Restrictions Precautions Precautions: Fall Restrictions Weight Bearing Restrictions: No      Mobility  Bed Mobility Overal bed mobility: Needs Assistance Bed Mobility: Supine to Sit;Sit to Supine     Supine to sit: Min guard;HOB elevated Sit to supine: Min guard   General bed mobility comments: Min guard for safety throughout. Pt used UE to assist with LE movement during supine>sit transfer. Daughter reports this is baseline for pt.   Transfers Overall transfer level: Needs assistance Equipment used: Rolling walker (2 wheeled) Transfers: Sit to/from Stand Sit to Stand: Min guard;Min assist         General transfer comment: Min guard for safety during transfer from regular height surface. Min A for lift assist required for transfer from lower surface. Verbal cues for safe hand placement.  Education to daughter about assist required at home.   Ambulation/Gait Ambulation/Gait assistance: Min guard;Min assist Ambulation Distance (Feet): 125 Feet Assistive device: Rolling walker (2 wheeled) Gait Pattern/deviations: Step-through pattern;Decreased stride length;Narrow base of support Gait velocity: Decreased Gait velocity interpretation: Below normal speed for age/gender General Gait Details: Pt with slow, unsteady gait. Complaining that legs were weak and required standing rest breaks X 3 throughout gait. Required min A once during turn secondary to LOB. Education provided to maintain slow, steady pace to prevent falls. Verbal cues for sequencing using RW. Verbal cues for appropriate width of BOS during ambulation. Education to daughter about how to cue pt appropriately.   Stairs            Wheelchair Mobility    Modified Rankin (Stroke Patients Only)       Balance Overall balance assessment: Needs assistance Sitting-balance support: No upper extremity supported;Feet supported Sitting balance-Leahy Scale: Good     Standing balance support: Bilateral upper extremity supported;During functional activity Standing balance-Leahy Scale: Poor Standing balance comment: Reliant on RW for stability                              Pertinent Vitals/Pain Pain Assessment: 0-10 Pain Score: 5  Pain Location: Low back  Pain Descriptors / Indicators: Aching Pain Intervention(s): Limited activity within patient's tolerance;Monitored during session;Repositioned    Home Living Family/patient expects to be discharged to:: Private residence Living Arrangements: Other (Comment) (Family rotates and stays 24/7) Available Help at Discharge: Family;Available 24 hours/day Type of Home: House Home Access: Stairs  to enter Entrance Stairs-Rails: Right (Working to get bilat rail ) Technical brewer of Steps: 3 Home Layout: Two level;Able to live on main level with  bedroom/bathroom Home Equipment: Kasandra Knudsen - single point;Toilet riser Additional Comments: Daughter present to confirm home environment and history.     Prior Function Level of Independence: Needs assistance   Gait / Transfers Assistance Needed: Used cane but reports she didn't feel steady. Daughter reports someone was always with pt during mobility.   ADL's / Homemaking Assistance Needed: Assist needed for tub transfers and bathing when using tub. Pt reports she took sponge baths because she didn't like using the tub.         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 Grossly 3-/5 throughout. Pt reports this leg is weaker than LLE at baseline.  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 Grossly 3+/5 throughout.     Cervical / Trunk Assessment Cervical / Trunk Assessment: Kyphotic  Communication   Communication: No difficulties  Cognition Arousal/Alertness: Awake/alert Behavior During Therapy: WFL for tasks assessed/performed Overall Cognitive Status: History of cognitive impairments - at baseline                                 General Comments: Pt with brain mets from breast cancer. Pt's daughter reports her confusion has gotten better, but seems  to still be confused. Demonstrated difficulty with higher level processing and decreased safety awareness without cues. Pt oriented to person and place, but not time.       General Comments General comments (skin integrity, edema, etc.): Pt's daughter reporting pt does better with RW and would like to have the RW at home. Also expressed need for [facility] to increase safety at home. Educated about stair navigation at home and appropriate assist level needed. Pt's daughter reports pt  had been recieving HHPT services at home and they were setting up [facility] services for home. Pt reports HHPT had just signed off and were scheduled to re-visit in a couple of weeks to re-evaluate.     Exercises     Assessment/Plan    PT Assessment Patient needs continued PT services  PT Problem List Decreased strength;Decreased activity tolerance;Decreased balance;Decreased mobility;Decreased safety awareness;Decreased cognition;Decreased knowledge of use of DME;Decreased knowledge of precautions;Pain       PT Treatment Interventions DME instruction;Gait training;Stair training;Functional mobility training;Therapeutic activities;Therapeutic exercise;Balance training;Neuromuscular re-education;Patient/family education    PT Goals (Current goals can be found in the Care Plan section)  Acute Rehab PT Goals Patient Stated Goal: to strengthen legs  PT Goal Formulation: With patient Time For Goal Achievement: 12/21/16 Potential to Achieve Goals: Fair    Frequency Min 3X/week   Barriers to discharge        Co-evaluation               AM-PAC PT "6 Clicks" Daily Activity  Outcome Measure Difficulty turning over in bed (including adjusting bedclothes, sheets and blankets)?: A Lot Difficulty moving from lying on back to sitting on the side of the bed? : A Lot Difficulty sitting down on and standing up from a chair with arms (e.g., wheelchair, bedside commode, etc,.)?: Total Help needed moving to and from a bed to chair (including a wheelchair)?: A  Little Help needed walking in hospital room?: A Little Help needed climbing 3-5 steps with a railing? : A Lot 6 Click Score: 13    End of Session Equipment Utilized During Treatment: Gait belt Activity Tolerance: Patient tolerated treatment well Patient left: in bed;with call bell/phone within reach;with bed alarm set;with family/visitor present Nurse Communication: Mobility status PT Visit Diagnosis: Muscle weakness (generalized)  (M62.81);Unsteadiness on feet (R26.81)    Time: 3202-3343 PT Time Calculation (min) (ACUTE ONLY): 37 min   Charges:   PT Evaluation $PT Eval Moderate Complexity: 1 Procedure PT Treatments $Gait Training: 8-22 mins   PT G Codes:   PT G-Codes **NOT FOR INPATIENT CLASS** Functional Assessment Tool Used: AM-PAC 6 Clicks Basic Mobility;Clinical judgement Functional Limitation: Mobility: Walking and moving around Mobility: Walking and Moving Around Current Status (H6861): At least 40 percent but less than 60 percent impaired, limited or restricted Mobility: Walking and Moving Around Goal Status [phone]): At least 20 percent but less than 40 percent impaired, limited or restricted    Nicky Pugh, PT, DPT  Acute Rehabilitation Services  Pager: [place] 12/14/2016, 1:19 PM

## 2016-12-14 NOTE — Progress Notes (Addendum)
PROGRESS NOTE    Alyssa Benitez  WUX:[id number] DOB: [birth year] DOA: 12/13/2016 PCP: Lynnette Caffey, MD  Brief Narrative: Alyssa Benitez is a 51 y.o. female with medical history significant of metastatic breast cancer with biliary obstruction from metastatic disease resulting in stent placement; Brain mets s/p Cyberknife, recurrent periumbilical TTP; anxiety/panic disorder; and GERD presenting following a prolonged seizure. Yesterday evening, pt suddenly the patient started seizing.  She called 911 and was on the phone with them with the paramedics arrived 6+ minutes later and she was still seizing.  Seized nonstop for prolonged period, maybe 20+ minutes   Assessment & Plan:    Breakthrough seizure -known h/o seizures, and brain mets for almost 1 year, last seizure 1 year ago -complaint with Keppra -recent decrease in dose of dexamethasone 1 week ago to 2mg  BID -Neuro consult,  will likely need increase Keppra dose -increased decadron to 40mg  BID -CT head with calcified mets  Stage 4 breast CA with Brain mets/Lung mets/Metastatic biliary obstruction s/p Stent -Overall prognosis very poor -[phone] -currently on Chemo every 3weeks, last on 4/25    Hypokalemia -repleted , await am labs  Hypergylcemia -May be stress response and due to steroids -check CBGSs  DVT prophylaxis: Code Status:  Family Communication: Disposition Plan:   Consultants:   NEuro      Subjective: Feels ok now  Objective: Vitals:   12/13/16 2015 12/13/16 2253 12/14/16 0032 12/14/16 0623  BP: 137/87  109/78 112/77  Pulse:   83 84  Resp:   17 18  Temp:  98.3 F (36.8 C) 99 F (37.2 C) 98.4 F (36.9 C)  TempSrc:   Oral Oral  SpO2:   100% 100%    Intake/Output Summary (Last 24 hours) at 12/14/16 1014 Last data filed at 12/14/16 0656  Gross per 24 hour  Intake           1002.5 ml  Output              400 ml  Net            602.5 ml     There were no vitals filed for this visit.  Examination:  General exam: Appears calm and comfortable, chronically ill, frail female Respiratory system: Clear to auscultation. Respiratory effort normal. Cardiovascular system: S1 & S2 heard, RRR. No JVD, murmurs, rubs Gastrointestinal system: Abdomen is nondistended, soft and nontender. Normal bowel sounds heard. Central nervous system: Alert and oriented. No focal neurological deficits. Extremities: Symmetric 5 x 5 power. Skin: No rashes, lesions or ulcers Psychiatry: Judgement and insight appear normal. Mood & affect appropriate.     Data Reviewed:   CBC:  Recent Labs Lab 12/13/16 1945  WBC 3.9*  NEUTROABS 2.6  HGB 9.4*  HCT 29.3*  MCV 81.4  PLT 742*   Basic Metabolic Panel:  Recent Labs Lab 12/13/16 1945  NA 135  K 3.0*  CL 99*  CO2 26  GLUCOSE 351*  BUN 11  CREATININE 0.66  CALCIUM 8.4*   GFR: CrCl cannot be calculated (Unknown ideal weight.). Liver Function Tests: No results for input(s): AST, ALT, ALKPHOS, BILITOT, PROT, ALBUMIN in the last 168 hours. No results for input(s): LIPASE, AMYLASE in the last 168 hours. No results for input(s): AMMONIA in the last 168 hours. Coagulation Profile: No results for input(s): INR, PROTIME in the last 168 hours. Cardiac Enzymes: No results for input(s): CKTOTAL, CKMB, CKMBINDEX, TROPONINI in the last 168 hours. BNP (  last 3 results) No results for input(s): PROBNP in the last 8760 hours. HbA1C: No results for input(s): HGBA1C in the last 72 hours. CBG: No results for input(s): GLUCAP in the last 168 hours. Lipid Profile: No results for input(s): CHOL, HDL, LDLCALC, TRIG, CHOLHDL, LDLDIRECT in the last 72 hours. Thyroid Function Tests: No results for input(s): TSH, T4TOTAL, FREET4, T3FREE, THYROIDAB in the last 72 hours. Anemia Panel: No results for input(s): VITAMINB12, FOLATE, FERRITIN, TIBC, IRON, RETICCTPCT in the last 72 hours. Urine analysis: No  results found for: COLORURINE, APPEARANCEUR, LABSPEC, PHURINE, GLUCOSEU, HGBUR, BILIRUBINUR, KETONESUR, PROTEINUR, UROBILINOGEN, NITRITE, LEUKOCYTESUR Sepsis Labs: @LABRCNTIP (procalcitonin:4,lacticidven:4)  )No results found for this or any previous visit (from the past 240 hour(s)).       Radiology Studies: Ct Head Wo Contrast  Result Date: 12/13/2016 CLINICAL DATA:  Seizures.  Breast cancer with brain metastasis. EXAM: CT HEAD WITHOUT CONTRAST TECHNIQUE: Contiguous axial images were obtained from the base of the skull through the vertex without intravenous contrast. COMPARISON:  None. FINDINGS: Brain: Diffusely enlarged ventricles and subarachnoid spaces. Patchy white matter low density in both cerebral hemispheres. Right temporal lobe calcification. No intracranial hemorrhage, mass lesion or CT evidence of acute infarction. Vascular: No hyperdense vessel or unexpected calcification. Skull: Normal. Negative for fracture or focal lesion. Sinuses/Orbits: Small amount of mucosal thickening in both maxillary sinuses. Opacified right posterior ethmoid sinus air cell. Mild left ethmoid sinus mucosal thickening. Unremarkable orbits. Other: None. IMPRESSION: 1. No acute abnormality. 2. Mild diffuse cerebral and cerebellar atrophy. 3. Extensive patchy white matter low density in both cerebral hemispheres, most likely representing postradiation changes. 4. Right temporal lobe calcification, compatible with treated metastatic disease. Electronically Signed   By: Claudie Revering M.D.   On: 12/13/2016 20:34        Scheduled Meds: . dexamethasone  4 mg Intravenous Q12H  . enoxaparin (LOVENOX) injection  40 mg Subcutaneous Q24H  . fentaNYL  25 mcg Transdermal Q72H   Continuous Infusions: . sodium chloride 75 mL/hr (12/14/16 0103)  . levETIRAcetam Stopped (12/14/16 0141)     LOS: 0 days    Time spent: 46min    Domenic Polite, MD [facility] Pager [phone]  If 7PM-7AM, please contact  night-coverage [email or web address] Password [email or web address] 12/14/2016, 10:14 AM

## 2016-12-14 NOTE — Progress Notes (Signed)
Initial Nutrition Assessment  DOCUMENTATION CODES:   Non-severe (moderate) malnutrition in context of chronic illness  INTERVENTION:    Boost Plus PO TID, each supplement provides 360 kcal and 14 gm protein  NUTRITION DIAGNOSIS:   Malnutrition (moderate) related to chronic illness (cancer) as evidenced by mild depletion of body fat, mild depletion of muscle mass, percent weight loss (11% weight loss within 6 months).  GOAL:   Patient will meet greater than or equal to 90% of their needs  MONITOR:   PO intake, Supplement acceptance  REASON FOR ASSESSMENT:   Malnutrition Screening Tool    ASSESSMENT:   51 y.o. female with PMH of metastatic breast cancer with biliary obstruction from metastatic disease resulting in stent placement; recurrent periumbilical TTP; anxiety/panic disorder; and GERD presenting following a prolonged seizure.    Patient reports weight in November 2017 was 142 lbs; during CA treatment in November and December, she started losing weight due to poor oral intake. Recently started on Remeron and steroids, which have increased her appetite and intake. She has been drinking Boost supplements at home. She was 127 lbs at last MD appointment on April 25. 11% weight loss within 6 months is significant for time frame.  Nutrition-Focused physical exam completed. Findings are mild fat depletion, mild muscle depletion, and no edema.  Patient with 11% weight loss within the past 5-6 months. Labs reviewed: potassium 3.0 (L) Medications reviewed and include Decadron. S/P swallow evaluation with SLP this morning, diet has been advanced to regular.   Diet Order:  Diet regular Room service appropriate? Yes; Fluid consistency: Thin  Skin:  Reviewed, no issues  Last BM:  PTA  Height:   Ht Readings from Last 1 Encounters:  12/14/16 5\' 1"  (1.549 m)    Weight:   Wt Readings from Last 1 Encounters:  No data found for Wt   Per patient, 127 lbs on April 25 at MD  office.  Ideal Body Weight:  47.7 kg  Estimated Nutritional Needs:   Kcal:  1700-1900  Protein:  85-95 gm  Fluid:  1.7-1.9 L  EDUCATION NEEDS:   Education needs addressed  Molli Barrows, [place], [place], [place] Pager [phone] After Hours Pager [phone]

## 2016-12-14 NOTE — Evaluation (Signed)
Clinical/Bedside Swallow Evaluation Patient Details  Name: Alyssa Benitez MRN: [id number] Date of Birth: 21-Jun-1966  Today's Date: 12/14/2016 Time:        Past Medical History:  Past Medical History:  Diagnosis Date  . Acid reflux   . Breast cancer ([place]) 09/2014   stage 4   Past Surgical History:  Past Surgical History:  Procedure Laterality Date  . ABDOMINAL HYSTERECTOMY    . ELBOW SURGERY     HPI:  Alyssa Benitez a 51 y.o.femalewith medical history significant of metastatic breast cancer with biliary obstruction from metastatic disease (brain mets) resulting in stent placement; recurrent periumbilical TTP; anxiety/panic disorder; and GERD presenting following a prolonged seizure. CXR No acute abnormality. [place]   Assessment / Plan / Recommendation Clinical Impression  Oral and pharyngeal phases of swallow appeared WFL's. No indications of decreased airway protection. Pt's daughter reported diet restrictions for "hard/leafy food awhile back" but deny having thickened liquids and/or recent difficulty. Recommend regular texture diet, thin liquids, pills whole with thin and straws allowed. No further ST needed.   SLP Visit Diagnosis: Dysphagia, unspecified (R13.10)    Aspiration Risk  Mild aspiration risk    Diet Recommendation Regular;Thin liquid   Medication Administration: Whole meds with liquid Supervision: Patient able to self feed Postural Changes: Seated upright at 90 degrees    Other  Recommendations Oral Care Recommendations: Oral care BID   Follow up Recommendations None      Frequency and Duration            Prognosis        Swallow Study   General HPI: Alyssa Benitez a 51 y.o.femalewith medical history significant of metastatic breast cancer with biliary obstruction from metastatic disease (brain mets) resulting in stent placement; recurrent periumbilical TTP; anxiety/panic disorder; and GERD presenting following a  prolonged seizure. CXR No acute abnormality. Pt is from [place] visiting her [place]. Type of Study: Bedside Swallow Evaluation Previous Swallow Assessment: none Diet Prior to this Study: Regular;Thin liquids Temperature Spikes Noted: Yes Respiratory Status: Room air History of Recent Intubation: No Behavior/Cognition: Alert;Cooperative;Pleasant mood Oral Cavity Assessment: Within Functional Limits Oral Care Completed by SLP: No Oral Cavity - Dentition:  (natural and has partial) Vision: Functional for self-feeding Self-Feeding Abilities: Able to feed self Patient Positioning: Upright in bed Baseline Vocal Quality: Normal Volitional Cough: Strong Volitional Swallow: Able to elicit    Oral/Motor/Sensory Function Overall Oral Motor/Sensory Function: Within functional limits   Ice Chips Ice chips: Not tested   Thin Liquid Thin Liquid: Within functional limits Presentation: Cup;Straw    Nectar Thick Nectar Thick Liquid: Not tested   Honey Thick Honey Thick Liquid: Not tested   Puree Puree: Within functional limits   Solid   GO   Solid: Within functional limits    Functional Assessment Tool Used: skilled clinical judgement Functional Limitations: Swallowing Swallow Current Status (O7078): 0 percent impaired, limited or restricted Swallow Goal Status (M7544): 0 percent impaired, limited or restricted Swallow Discharge Status [phone]): 0 percent impaired, limited or restricted   Alyssa Benitez 12/14/2016,2:27 PM  Alyssa Benitez.Ed [employer] [phone]

## 2016-12-14 NOTE — Progress Notes (Signed)
Inpatient Diabetes Program Recommendations  AACE/ADA: New Consensus Statement on Inpatient Glycemic Control (2015)  Target Ranges:  Prepandial:   less than 140 mg/dL      Peak postprandial:   less than 180 mg/dL (1-2 hours)      Critically ill patients:  140 - 180 mg/dL   No results found for: GLUCAP, HGBA1C  Review of Glycemic Control Results for CALLIA, SWIM (MRN [id number]) as of 12/14/2016 10:58  Ref. Range 12/13/2016 19:45  Glucose Latest Ref Range: 65 - 99 mg/dL 351 (H)   Diabetes history: No prior hx  Inpatient Diabetes Program Recommendations:  Please consider Novolog correction 0-9 units tid + 0-5 units hs while on steroids.  Thank you, Nani Gasser. Chrisann Melaragno, RN, MSN, CDE  Diabetes Coordinator Inpatient Glycemic Control Team Team Pager [phone] (8am-5pm) 12/14/2016 10:59 AM

## 2016-12-15 MED ORDER — POLYETHYLENE GLYCOL 3350 17 G PO PACK
17.0000 g | PACK | Freq: Every day | ORAL | Status: DC
  Administered 2016-12-15: 17 g via ORAL
  Filled 2016-12-15: qty 1

## 2016-12-15 MED ORDER — LEVETIRACETAM 1000 MG PO TABS: 1000.0000 mg | ORAL_TABLET | Freq: Two times a day (BID) | ORAL | 0 refills | Status: AC

## 2016-12-15 MED ORDER — LEVETIRACETAM 500 MG PO TABS
1000.0000 mg | ORAL_TABLET | Freq: Two times a day (BID) | ORAL | Status: DC
  Administered 2016-12-15: 1000 mg via ORAL
  Filled 2016-12-15: qty 2

## 2016-12-15 MED ORDER — DEXAMETHASONE 4 MG PO TABS: 4.0000 mg | ORAL_TABLET | Freq: Two times a day (BID) | ORAL | Status: AC

## 2016-12-15 MED ORDER — DEXAMETHASONE 4 MG PO TABS
4.0000 mg | ORAL_TABLET | Freq: Two times a day (BID) | ORAL | Status: DC
  Administered 2016-12-15: 4 mg via ORAL
  Filled 2016-12-15: qty 1

## 2016-12-15 MED ORDER — POLYETHYLENE GLYCOL 3350 17 G PO PACK: 17.0000 g | PACK | Freq: Every day | ORAL | 0 refills | Status: AC

## 2016-12-15 NOTE — Progress Notes (Signed)
Cheyenne Adas to be D/C'd Home per MD order.  Discussed with the patient and all questions fully answered.  VSS, Skin clean, dry and intact without evidence of skin break down, no evidence of skin tears noted. IV catheter discontinued intact. Site without signs and symptoms of complications. Dressing and pressure applied.  An After Visit Summary was printed and given to the patient. Patient received prescription.  D/c education completed with patient/family including follow up instructions, medication list, d/c activities limitations if indicated, with other d/c instructions as indicated by MD - patient able to verbalize understanding, all questions fully answered.   Patient instructed to return to ED, call 911, or call MD for any changes in condition.   Patient escorted via [place], and D/C home via private auto.  Karolee Ohs 12/15/2016 3:26 PM

## 2016-12-16 NOTE — Discharge Summary (Signed)
Physician Discharge Summary  Alyssa Benitez JME:[id number] DOB: 1965-10-26 DOA: 12/13/2016  PCP: Lynnette Caffey, MD  Admit date: 12/13/2016 Discharge date: 12/15/2016  Time spent: 35 minutes  Recommendations for Outpatient Follow-up:  1. Oncology Dr.Scott Tyrell Antonio in 1week, has an appt next week -No driving or Operating machinery for atleast 41months  Discharge Diagnoses:  Principal Problem:   Seizure ([place])   Brain metastasis   Pulm mets   Biliary mets s/p stenting   Metastatic breast cancer ([place])   Hyperglycemia   Hypokalemia   Seizures ([place])   Discharge Condition: stable  Diet recommendation: regular  There were no vitals filed for this visit.  History of present illness:  Alyssa Benitez a 51 y.o.femalewith medical history significant of metastatic breast cancer with biliary obstruction from metastatic disease resulting in stent placement; Brain mets s/p Cyberknife, recurrent periumbilical TTP; anxiety/panic disorder; and GERD presenting following a prolonged seizure. Yesterday evening, pt suddenly the patient started seizing. She called 911 and was on the phone with them with the paramedics arrived 6+ minutes later and she was still seizing. Seized nonstop for prolonged period, for 20-66min  Hospital Course:  Breakthrough seizure -known h/o seizures, and brain mets for almost 1 year, last seizure 1 year ago -pt/daughter reported complaince with Keppra -recent decrease in dose of dexamethasone 1 week ago to 2mg  BID -Neuro consulted,  we increased Keppra dose to 1000mg  BID -increased decadron to 4mg  BID -CT head with calcified mets -EEG noted focal slowing but no epileptiform activity  Stage 4 breast CA with Brain mets/Lung mets/Metastatic biliary obstruction s/p Stent -Overall prognosis very poor -[facility],  -currently on Chemo every 3weeks, last on 4/25 -FU with Dr.Kruger    Hypokalemia -repleted  Hypergylcemia -due to  steroids -SSI used while inpatient  Consultations:  Neuro Dr.Oster  Discharge Exam: Vitals:   12/15/16 0601 12/15/16 1333  BP: 111/65 109/83  Pulse: 93 80  Resp: 18 18  Temp: 98.3 F (36.8 C) 99.4 F (37.4 C)    General: AAOx3 Cardiovascular: S1S2/RRR Respiratory: CTAB  Discharge Instructions   Discharge Instructions    Diet - low sodium heart healthy    Complete by:  As directed    Increase activity slowly    Complete by:  As directed      Discharge Medication List as of 12/15/2016 11:52 AM    START taking these medications   Details  polyethylene glycol (MIRALAX / GLYCOLAX) packet Take 17 g by mouth daily., Starting Sat 12/15/2016, Print      CONTINUE these medications which have CHANGED   Details  dexamethasone (DECADRON) 4 MG tablet Take 1 tablet (4 mg total) by mouth 2 (two) times daily., Starting Sat 12/15/2016, No Print    levETIRAcetam (KEPPRA) 1000 MG tablet Take 1 tablet (1,000 mg total) by mouth 2 (two) times daily., Starting Sat 12/15/2016, Print      CONTINUE these medications which have NOT CHANGED   Details  ado-trastuzumab emtansine 3.6 mg/kg in sodium chloride 0.9 % 250 mL Inject 3.6 mg/kg into the vein every 21 ( twenty-one) days., Historical Med    dronabinol (MARINOL) 2.5 MG capsule Take 2.5 mg by mouth 2 (two) times daily before lunch and supper., Historical Med    fentaNYL (DURAGESIC - DOSED MCG/HR) 50 MCG/HR Place 25 mcg onto the skin every 3 (three) days., Historical Med    ondansetron (ZOFRAN) 4 MG tablet Take 4 mg by mouth every 8 (eight) hours as needed for nausea  or vomiting., Historical Med    potassium chloride SA (K-DUR,KLOR-CON) 20 MEQ tablet Take 20 mEq by mouth every evening., Historical Med    traMADol (ULTRAM) 50 MG tablet Take by mouth every 12 (twelve) hours as needed., Historical Med       Allergies  Allergen Reactions  . Morphine And Related Other (See Comments)    Patient preference   Follow-up Information    Justin Mend, MD Follow up in 1 week(s).   Specialty:  Hematology and Oncology Contact information: [address] [place] [address] [phone]            The results of significant diagnostics from this hospitalization (including imaging, microbiology, ancillary and laboratory) are listed below for reference.    Significant Diagnostic Studies: Ct Head Wo Contrast  Result Date: 12/13/2016 CLINICAL DATA:  Seizures.  Breast cancer with brain metastasis. EXAM: CT HEAD WITHOUT CONTRAST TECHNIQUE: Contiguous axial images were obtained from the base of the skull through the vertex without intravenous contrast. COMPARISON:  None. FINDINGS: Brain: Diffusely enlarged ventricles and subarachnoid spaces. Patchy white matter low density in both cerebral hemispheres. Right temporal lobe calcification. No intracranial hemorrhage, mass lesion or CT evidence of acute infarction. Vascular: No hyperdense vessel or unexpected calcification. Skull: Normal. Negative for fracture or focal lesion. Sinuses/Orbits: Small amount of mucosal thickening in both maxillary sinuses. Opacified right posterior ethmoid sinus air cell. Mild left ethmoid sinus mucosal thickening. Unremarkable orbits. Other: None. IMPRESSION: 1. No acute abnormality. 2. Mild diffuse cerebral and cerebellar atrophy. 3. Extensive patchy white matter low density in both cerebral hemispheres, most likely representing postradiation changes. 4. Right temporal lobe calcification, compatible with treated metastatic disease. Electronically Signed   By: Claudie Revering M.D.   On: 12/13/2016 20:34    Microbiology: No results found for this or any previous visit (from the past 240 hour(s)).   Labs: Basic Metabolic Panel:  Recent Labs Lab 12/13/16 1945 12/14/16 1014  NA 135 139  K 3.0* 3.5  CL 99* 104  CO2 26 22  GLUCOSE 351* 193*  BUN 11 5*  CREATININE 0.66 0.44  CALCIUM 8.4* 8.8*   Liver Function Tests:  Recent Labs Lab 12/14/16 1014   AST 104*  ALT 169*  ALKPHOS 293*  BILITOT 1.0  PROT 5.8*  ALBUMIN 3.1*   No results for input(s): LIPASE, AMYLASE in the last 168 hours. No results for input(s): AMMONIA in the last 168 hours. CBC:  Recent Labs Lab 12/13/16 1945  WBC 3.9*  NEUTROABS 2.6  HGB 9.4*  HCT 29.3*  MCV 81.4  PLT 110*   Cardiac Enzymes: No results for input(s): CKTOTAL, CKMB, CKMBINDEX, TROPONINI in the last 168 hours. BNP: BNP (last 3 results) No results for input(s): BNP in the last 8760 hours.  ProBNP (last 3 results) No results for input(s): PROBNP in the last 8760 hours.  CBG:  Recent Labs Lab 12/14/16 1851  GLUCAP 332*       SignedDomenic Polite MD.  [facility] 12/16/2016, 12:02 PM

## 2017-04-13 DEATH — deceased

## 2017-12-26 IMAGING — CT CT HEAD W/O CM
3 of 5 series · 15 of 47 positions shown, 18 images · non-contrast
Comparison: None.

CLINICAL DATA: Seizures.  Breast cancer with brain metastasis.

EXAM:
CT HEAD WITHOUT CONTRAST
TECHNIQUE: Contiguous axial images were obtained from the base of the skull
through the vertex without intravenous contrast.

[Series 2: head 5.0 h30s · axial · 0.41mm/px · z∈[-84,+36]mm · 9 of 30 slices shown, 12 images]
[im 3/30  brain]
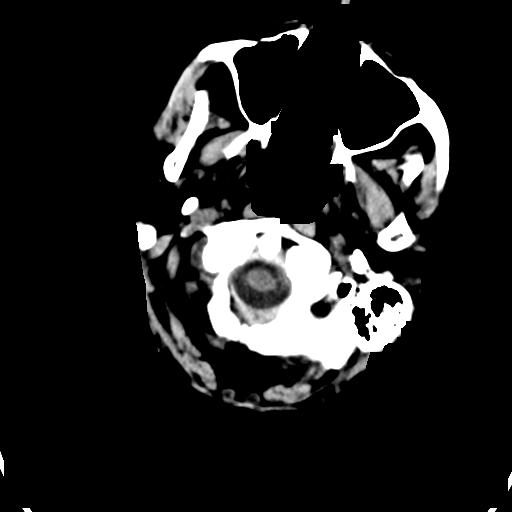
[im 3/30  bone]
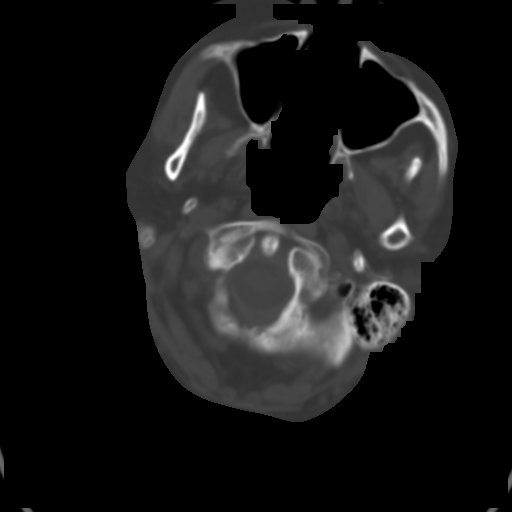
[im 6/30  brain]
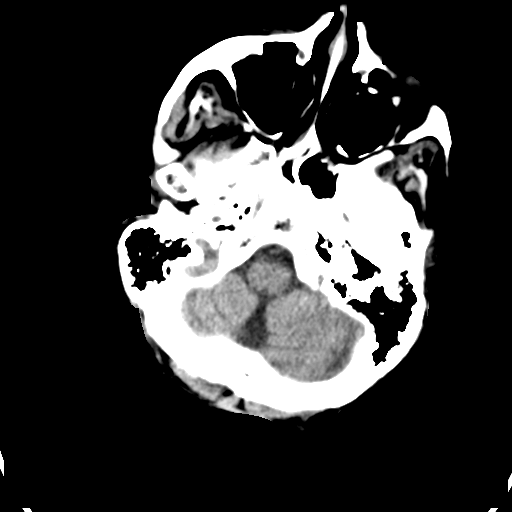
[im 8/30  brain]
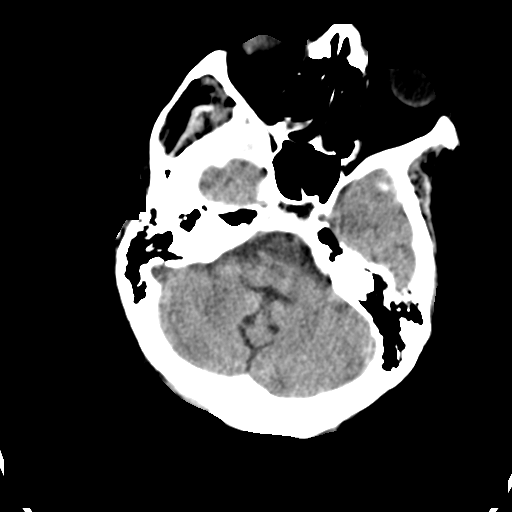
[im 11/30  brain]
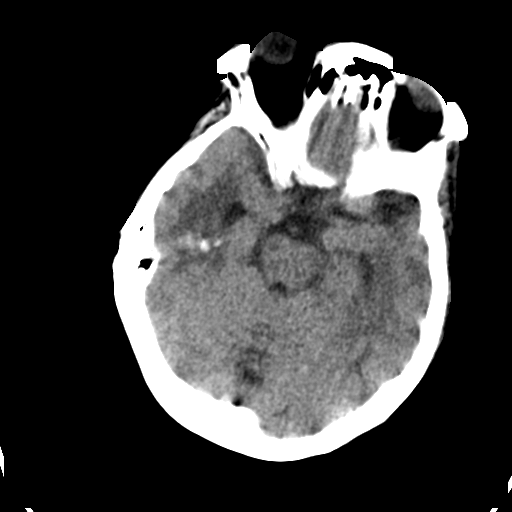
[im 16/30  brain]
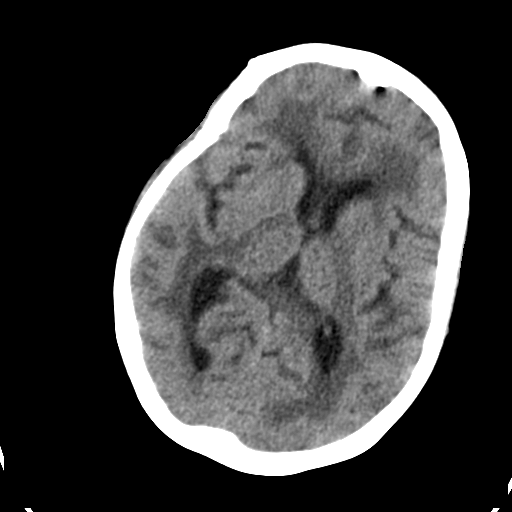
[im 16/30  bone]
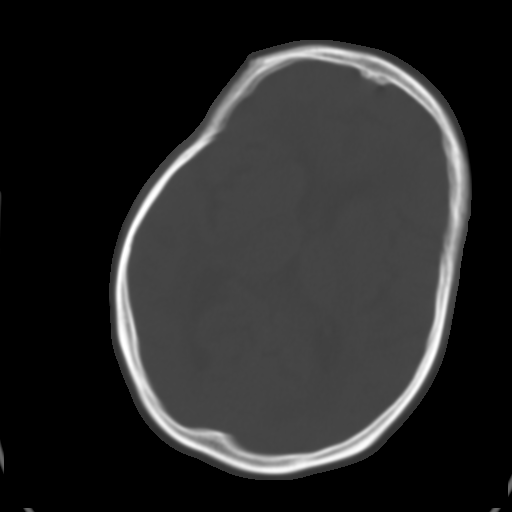
[im 19/30  brain]
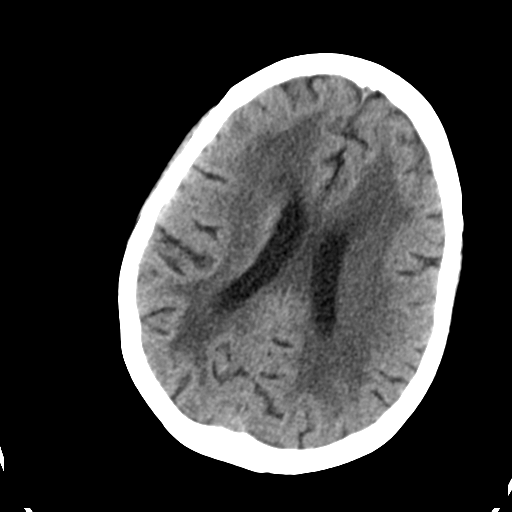
[im 22/30  brain]
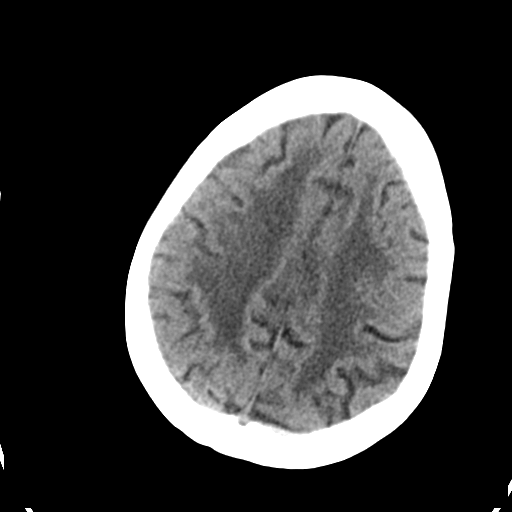
[im 24/30  brain]
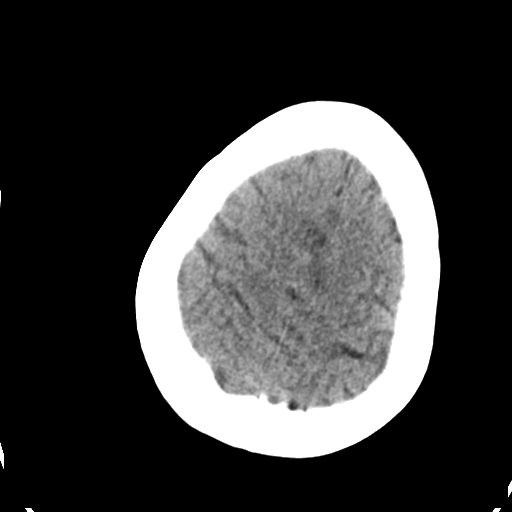
[im 27/30  brain]
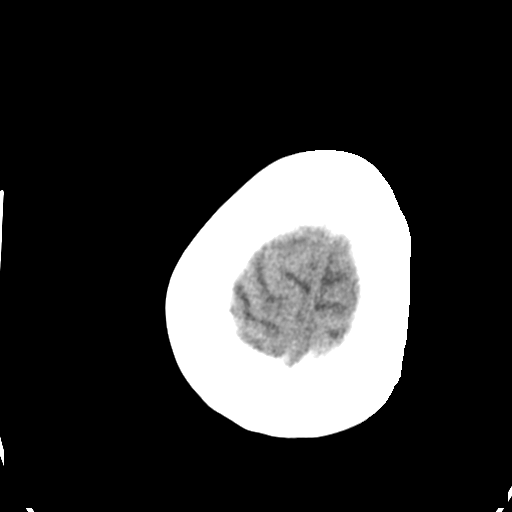
[im 27/30  bone]
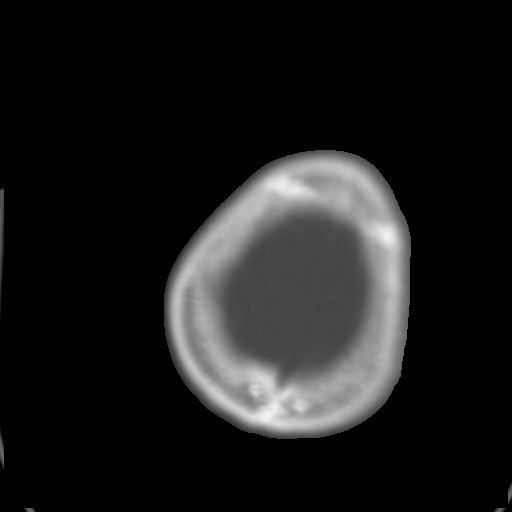

[Series 4: head 3.0 mpr cor · coronal · 0.29mm/px · 3 of 67 slices shown]
[im 23/67  brain]
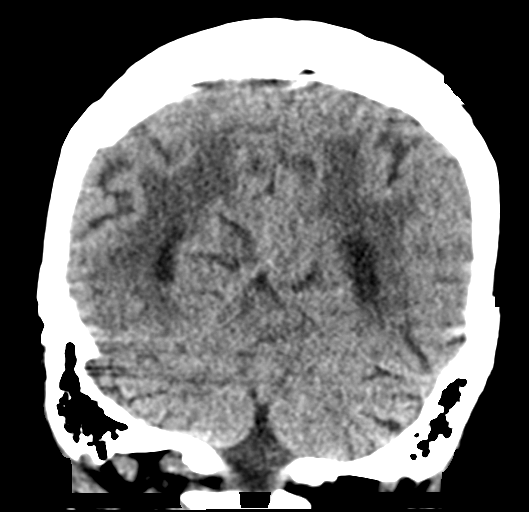
[im 30/67  brain]
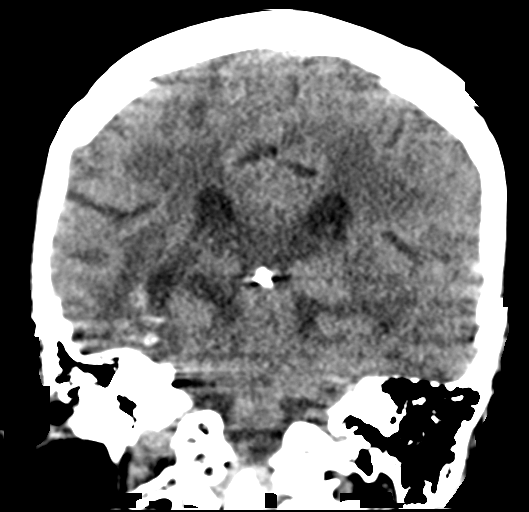
[im 37/67  brain]
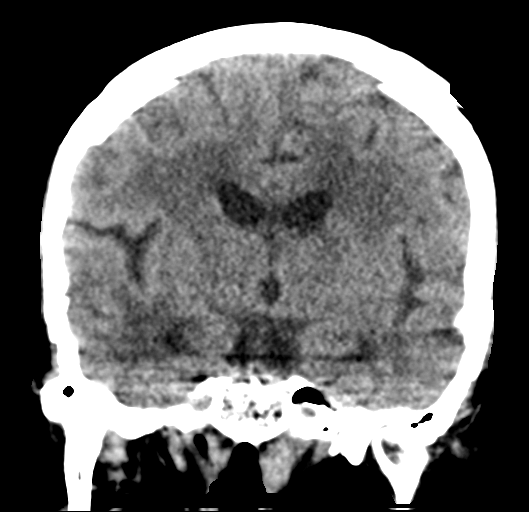

[Series 5: head 3.0 mpr sag · sagittal · 0.29mm/px · 3 of 51 slices shown]
[im 17/51  brain]
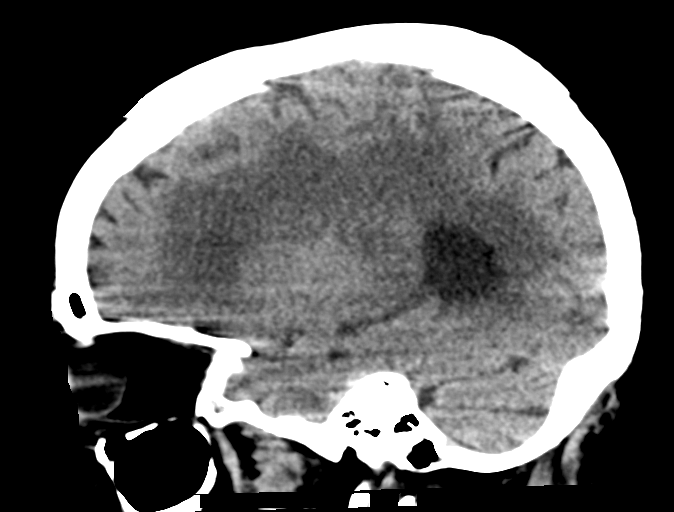
[im 26/51  brain]
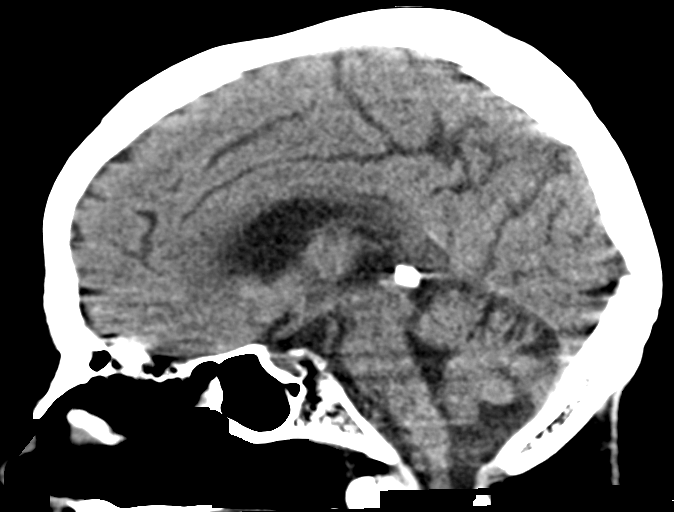
[im 34/51  brain]
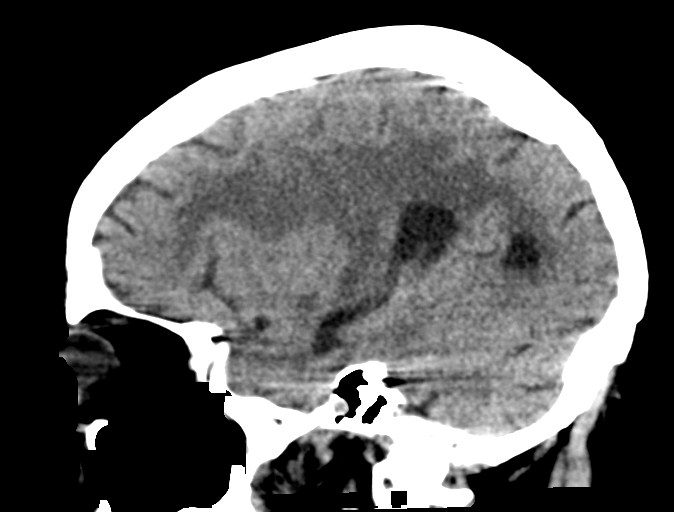

[15 of 47 positions shown; findings below may reference images not displayed]

FINDINGS: Brain: Diffusely enlarged ventricles and subarachnoid spaces. Patchy
white matter low density in both cerebral hemispheres. Right
temporal lobe calcification. No intracranial hemorrhage, mass lesion
or CT evidence of acute infarction.

Vascular: No hyperdense vessel or unexpected calcification.

Skull: Normal. Negative for fracture or focal lesion.

Sinuses/Orbits: Small amount of mucosal thickening in both maxillary
sinuses. Opacified right posterior ethmoid sinus air cell. Mild left
ethmoid sinus mucosal thickening. Unremarkable orbits.

Other: None.
IMPRESSION: 1. No acute abnormality.
2. Mild diffuse cerebral and cerebellar atrophy.
3. Extensive patchy white matter low density in both cerebral
hemispheres, most likely representing postradiation changes.
4. Right temporal lobe calcification, compatible with treated
metastatic disease.
